# Patient Record
Sex: Female | Born: 2005 | Hispanic: Yes | Marital: Single | Smoking: Never smoker
Health system: Southern US, Community
[De-identification: ages and names within clinical notes are randomized; demographics above are authoritative.]

## PROBLEM LIST (undated history)

## (undated) DIAGNOSIS — J45909 Unspecified asthma, uncomplicated: Secondary | ICD-10-CM

---
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
## 2023-01-17 ENCOUNTER — Ambulatory Visit
Admission: RE | Admit: 2023-01-17 | Discharge: 2023-01-17 | Disposition: A | Discharge status: Home / Self Care | Payer: Medicaid Other | Source: Ambulatory Visit | Attending: Family Medicine | Admitting: Family Medicine

## 2023-01-17 ENCOUNTER — Other Ambulatory Visit: Payer: Self-pay

## 2023-01-17 DIAGNOSIS — R059 Cough, unspecified: Secondary | ICD-10-CM | POA: Insufficient documentation

## 2023-03-04 ENCOUNTER — Other Ambulatory Visit: Payer: Self-pay

## 2023-03-07 ENCOUNTER — Ambulatory Visit (INDEPENDENT_AMBULATORY_CARE_PROVIDER_SITE_OTHER): Payer: Medicaid Other | Admitting: Gastroenterology

## 2023-03-07 ENCOUNTER — Encounter: Payer: Self-pay | Admitting: Gastroenterology

## 2023-03-07 VITALS — BP 118/84 | HR 82 | Temp 98.6°F | Ht 60.0 in | Wt 169.2 lb

## 2023-03-07 DIAGNOSIS — R1013 Epigastric pain: Secondary | ICD-10-CM | POA: Diagnosis not present

## 2023-03-07 NOTE — Progress Notes (Signed)
Arlyss Repress, MD [address]  [address]  [place], [place] [address]  Main: [phone]  Fax: [phone]    Gastroenterology Consultation  Referring Provider:     [facility] Primary Care Physician:  Leanna Sato, MD Primary Gastroenterologist:  Dr. Arlyss Repress Reason for Consultation: Chronic nausea        HPI:   Charlene Russell is a 17 y.o. female referred by Dr. Marvis Moeller, Doralee Albino, MD  for consultation & management of chronic nausea.  Patient reports that for about the ER, she has been experiencing chronic nausea associated with early satiety.  She reports that the site of food and smell of food makes her nauseous and worse after eating associated with feeling full easily and some abdominal bloating and epigastric discomfort.  Reports regular bowel movements, some abdominal cramps, denies any abdominal pain.  She went to ER in 12/2022 due to worsening of the above symptoms, labs including CBC, serum lipase, CMP and urinalysis were unremarkable.  She lost about 5 to 6 pounds due to the symptoms.  She is in high school, senior, denies any stress in her life She does not smoke, denies illicit drug use, denies regular alcohol use She also reports irregular menstrual cycles  NSAIDs: None  Antiplts/Anticoagulants/Anti thrombotics: None  GI Procedures: None  Denies any family history of GI malignancy, chronic GI medical conditions in the family  Past Medical History:  Diagnosis Date   Asthma     History reviewed. No pertinent surgical history.   Current Outpatient Medications:    butalbital-acetaminophen-caffeine (FIORICET) 50-325-40 MG tablet, Take 1-2 tablets by mouth every 6 (six) hours as needed for headache., Disp: 20 tablet, Rfl: 0   cetirizine (ZYRTEC) 10 MG tablet, Take by mouth., Disp: , Rfl:    montelukast (SINGULAIR) 10 MG tablet, Take by mouth., Disp: , Rfl:    ondansetron (ZOFRAN-ODT) 4 MG disintegrating tablet, Take 4 mg by mouth every 8  (eight) hours as needed., Disp: , Rfl:    SYMBICORT 160-4.5 MCG/ACT inhaler, Inhale 2 puffs into the lungs daily., Disp: , Rfl:    Family History  Problem Relation Age of Onset   Gestational diabetes Mother    Diabetes Father    Diabetes Maternal Grandmother    Diabetes Maternal Grandfather      Social History   Tobacco Use   Smoking status: Never    Passive exposure: Never   Smokeless tobacco: Never  Substance Use Topics   Alcohol use: Not Currently   Drug use: Not Currently    Allergies as of 03/07/2023   (No Known Allergies)    Review of Systems:    All systems reviewed and negative except where noted in HPI.   Physical Exam:  BP 118/84 (BP Location: Left Arm, Patient Position: Sitting, Cuff Size: Normal)   Pulse 82   Temp 98.6 F (37 C) (Oral)   Ht 5' (1.524 m)   Wt 169 lb 4 oz (76.8 kg)   BMI 33.05 kg/m  No LMP recorded. (Menstrual status: Irregular Periods).  General:   Alert,  Well-developed, well-nourished, pleasant and cooperative in NAD Head:  Normocephalic and atraumatic. Eyes:  Sclera clear, no icterus.   Conjunctiva pink. Ears:  Normal auditory acuity. Nose:  No deformity, discharge, or lesions. Mouth:  No deformity or lesions,oropharynx pink & moist. Neck:  Supple; no masses or thyromegaly. Lungs:  Respirations even and unlabored.  Clear throughout to auscultation.   No wheezes, crackles,  or rhonchi. No acute distress. Heart:  Regular rate and rhythm; no murmurs, clicks, rubs, or gallops. Abdomen:  Normal bowel sounds. Soft, non-tender and non-distended without masses, hepatosplenomegaly or hernias noted.  No guarding or rebound tenderness.   Rectal: Not performed Msk:  Symmetrical without gross deformities. Good, equal movement & strength bilaterally. Pulses:  Normal pulses noted. Extremities:  No clubbing or edema.  No cyanosis. Neurologic:  Alert and oriented x3;  grossly normal neurologically. Skin:  Intact without significant lesions or  rashes. No jaundice. Psych:  Alert and cooperative. Normal mood and affect.  Imaging Studies: None  Assessment and Plan:   KHANIYA KUST is a 17 y.o. female with irregular menstrual cycles, obesity BMI 33 is seen in consultation for chronic nausea and symptoms of dyspepsia  Recommend H. pylori breath test If negative, recommend right upper quadrant ultrasound  Irregular menstrual cycles, obesity?  PCOS Advised to see OB/GYN Encouraged to follow healthy diet and exercise 5 days a week   Follow up based on the above workup and contact via MyChart as needed   Arlyss Repress, MD

## 2023-03-09 LAB — H. PYLORI BREATH TEST: H pylori Breath Test: NEGATIVE

## 2023-03-10 ENCOUNTER — Telehealth: Payer: Self-pay

## 2023-03-10 DIAGNOSIS — R1013 Epigastric pain: Secondary | ICD-10-CM

## 2023-03-10 NOTE — Telephone Encounter (Signed)
Patient called back and left a message and said to call her back at [phone]. Return patient call and and patient verbalized understanding of results and is ok with ultrasound. Called and got patient schedule for 03/11/2023 Arrive at 9:15 for a 9:30 am scan. Nothing to eat or drink after midnight. Patient verbalized understanding of instructions

## 2023-03-10 NOTE — Telephone Encounter (Signed)
Called and and patient mother answered and she is not on patient DPR form she states she will let patient know to call me back

## 2023-03-10 NOTE — Telephone Encounter (Signed)
-----   Message from Toney Reil, MD sent at 03/09/2023  4:53 PM EDT ----- H. pylori breath test came back negative, recommend right upper quadrant ultrasound to evaluate for gallbladder etiology  RV

## 2023-03-11 ENCOUNTER — Ambulatory Visit
Admission: RE | Admit: 2023-03-11 | Discharge: 2023-03-11 | Disposition: A | Discharge status: Home / Self Care | Payer: Medicaid Other | Source: Ambulatory Visit | Attending: Gastroenterology | Admitting: Gastroenterology

## 2023-03-11 DIAGNOSIS — R1013 Epigastric pain: Secondary | ICD-10-CM | POA: Diagnosis present

## 2023-03-14 ENCOUNTER — Telehealth: Payer: Self-pay

## 2023-03-14 NOTE — Telephone Encounter (Signed)
-----   Message from Toney Reil, MD sent at 03/14/2023  8:38 AM EDT ----- Please inform patient that the ultrasound of her gallbladder came back normal.  Her symptoms could be related to PCOS.  I discussed about her seeing an OB/GYN.  RV

## 2023-03-14 NOTE — Telephone Encounter (Signed)
Patient verbalized understanding of results  

## 2023-05-06 ENCOUNTER — Encounter (INDEPENDENT_AMBULATORY_CARE_PROVIDER_SITE_OTHER): Payer: Self-pay

## 2023-06-28 ENCOUNTER — Other Ambulatory Visit: Payer: Self-pay | Admitting: Family Medicine

## 2023-06-28 DIAGNOSIS — N926 Irregular menstruation, unspecified: Secondary | ICD-10-CM

## 2023-07-01 ENCOUNTER — Ambulatory Visit: Payer: Medicaid Other

## 2023-10-10 ENCOUNTER — Other Ambulatory Visit: Payer: Self-pay

## 2023-10-10 ENCOUNTER — Emergency Department
Admission: EM | Admit: 2023-10-10 | Discharge: 2023-10-10 | Disposition: A | Discharge status: Home / Self Care | Payer: Medicaid Other | Attending: Emergency Medicine | Admitting: Emergency Medicine

## 2023-10-10 DIAGNOSIS — X12XXXA Contact with other hot fluids, initial encounter: Secondary | ICD-10-CM | POA: Diagnosis not present

## 2023-10-10 DIAGNOSIS — T2112XA Burn of first degree of abdominal wall, initial encounter: Secondary | ICD-10-CM | POA: Diagnosis not present

## 2023-10-10 DIAGNOSIS — T310 Burns involving less than 10% of body surface: Secondary | ICD-10-CM | POA: Diagnosis not present

## 2023-10-10 DIAGNOSIS — T300 Burn of unspecified body region, unspecified degree: Secondary | ICD-10-CM

## 2023-10-10 DIAGNOSIS — Y9289 Other specified places as the place of occurrence of the external cause: Secondary | ICD-10-CM | POA: Insufficient documentation

## 2023-10-10 DIAGNOSIS — T2102XA Burn of unspecified degree of abdominal wall, initial encounter: Secondary | ICD-10-CM | POA: Diagnosis present
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
## 2023-10-10 NOTE — Discharge Instructions (Signed)
Cleanse burn with mild soap and water or dilute antiseptic solution. Use topical antibiotics such as neosporin or bacitracin that you can receive OTC at your local pharmacy  If the burn begins to blister, DO NOT pick blister. Leave intact and apply a clean dressing to the area.   Please review burn care education packet attached. Follow up with your PCP as needed. Take ibuprofen and tylenol for pain as needed.

## 2024-05-17 ENCOUNTER — Emergency Department
Admission: EM | Admit: 2024-05-17 | Discharge: 2024-05-17 | Disposition: A | Discharge status: Home / Self Care | Attending: Emergency Medicine | Admitting: Emergency Medicine

## 2024-05-17 ENCOUNTER — Encounter: Payer: Self-pay | Admitting: Emergency Medicine

## 2024-05-17 ENCOUNTER — Other Ambulatory Visit: Payer: Self-pay

## 2024-05-17 ENCOUNTER — Emergency Department

## 2024-05-17 DIAGNOSIS — J45909 Unspecified asthma, uncomplicated: Secondary | ICD-10-CM | POA: Diagnosis not present

## 2024-05-17 DIAGNOSIS — M79601 Pain in right arm: Secondary | ICD-10-CM | POA: Insufficient documentation

## 2024-05-17 MED ORDER — DICLOFENAC SODIUM 1 % EX GEL: 2.0000 g | Freq: Four times a day (QID) | CUTANEOUS | 0 refills | Status: AC

## 2024-05-17 MED ORDER — IBUPROFEN 800 MG PO TABS
800.0000 mg | ORAL_TABLET | Freq: Once | ORAL | Status: AC
  Administered 2024-05-17: 800 mg via ORAL
  Filled 2024-05-17: qty 1

## 2024-05-17 MED ORDER — IBUPROFEN 600 MG PO TABS: 600.0000 mg | ORAL_TABLET | Freq: Three times a day (TID) | ORAL | 0 refills | Status: AC | PRN

## 2024-05-17 NOTE — Discharge Instructions (Signed)
 You have been diagnosed with right arm pain.  Your ultrasound did not show any abscess, mass, or cyst.  Please take ibuprofen  1 tablet by mouth every 8 hours after main meals.  Please use the sling.  You can apply warm compresses or ice pack every 3 hours for 10 minutes.  You can apply Voltaren  gel in your arm after applying warm compresses.  Please come back to ED or go to your PCP if you have new symptoms or symptoms worsen.  You can have a follow-up with [facility].

## 2024-05-17 NOTE — ED Triage Notes (Signed)
 Patient to ED via POV for right arm pain. States ongoing x2 weeks. Denies injury. Reports pain intermittently- at times just in her hand but sometimes the whole arm.

## 2024-05-17 NOTE — ED Notes (Signed)
 Pt provided discharge instructions and prescription information. Pt was given the opportunity to ask questions and questions were answered.
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
